# Patient Record
Sex: Male | Born: 1980 | Race: Asian | Hispanic: No | Marital: Married | Smoking: Never smoker
Health system: Southern US, Community
[De-identification: ages and names within clinical notes are randomized; demographics above are authoritative.]

## PROBLEM LIST (undated history)

## (undated) HISTORY — PX: NO PAST SURGERIES: SHX2092

---

## 2015-12-01 DIAGNOSIS — Z0000 Encounter for general adult medical examination without abnormal findings: Secondary | ICD-10-CM | POA: Diagnosis not present

## 2015-12-08 DIAGNOSIS — Z23 Encounter for immunization: Secondary | ICD-10-CM | POA: Diagnosis not present

## 2015-12-08 DIAGNOSIS — Z1389 Encounter for screening for other disorder: Secondary | ICD-10-CM | POA: Diagnosis not present

## 2015-12-08 DIAGNOSIS — Z6835 Body mass index (BMI) 35.0-35.9, adult: Secondary | ICD-10-CM | POA: Diagnosis not present

## 2015-12-08 DIAGNOSIS — Z0000 Encounter for general adult medical examination without abnormal findings: Secondary | ICD-10-CM | POA: Diagnosis not present

## 2016-07-21 ENCOUNTER — Emergency Department (HOSPITAL_BASED_OUTPATIENT_CLINIC_OR_DEPARTMENT_OTHER)
Admission: EM | Admit: 2016-07-21 | Discharge: 2016-07-22 | Disposition: A | Discharge status: Home / Self Care | Payer: BLUE CROSS/BLUE SHIELD | Attending: Emergency Medicine | Admitting: Emergency Medicine

## 2016-07-21 ENCOUNTER — Emergency Department (HOSPITAL_BASED_OUTPATIENT_CLINIC_OR_DEPARTMENT_OTHER): Payer: BLUE CROSS/BLUE SHIELD

## 2016-07-21 ENCOUNTER — Encounter (HOSPITAL_BASED_OUTPATIENT_CLINIC_OR_DEPARTMENT_OTHER): Payer: Self-pay | Admitting: *Deleted

## 2016-07-21 DIAGNOSIS — R0602 Shortness of breath: Secondary | ICD-10-CM | POA: Diagnosis not present

## 2016-07-21 DIAGNOSIS — J209 Acute bronchitis, unspecified: Secondary | ICD-10-CM

## 2016-07-21 LAB — COMPREHENSIVE METABOLIC PANEL
ALT: 40 U/L (ref 17–63)
AST: 31 U/L (ref 15–41)
Albumin: 4.5 g/dL (ref 3.5–5.0)
Alkaline Phosphatase: 72 U/L (ref 38–126)
Anion gap: 10 (ref 5–15)
BILIRUBIN TOTAL: 1.2 mg/dL (ref 0.3–1.2)
BUN: 12 mg/dL (ref 6–20)
CHLORIDE: 104 mmol/L (ref 101–111)
CO2: 25 mmol/L (ref 22–32)
CREATININE: 0.8 mg/dL (ref 0.61–1.24)
Calcium: 8.9 mg/dL (ref 8.9–10.3)
Glucose, Bld: 111 mg/dL — ABNORMAL HIGH (ref 65–99)
Potassium: 4 mmol/L (ref 3.5–5.1)
Sodium: 139 mmol/L (ref 135–145)
Total Protein: 8 g/dL (ref 6.5–8.1)

## 2016-07-21 LAB — CBC WITH DIFFERENTIAL/PLATELET
BASOS ABS: 0.1 10*3/uL (ref 0.0–0.1)
Basophils Relative: 1 %
EOS PCT: 9 %
Eosinophils Absolute: 1.2 10*3/uL — ABNORMAL HIGH (ref 0.0–0.7)
HEMATOCRIT: 44.3 % (ref 39.0–52.0)
Hemoglobin: 15.6 g/dL (ref 13.0–17.0)
LYMPHS PCT: 20 %
Lymphs Abs: 2.7 10*3/uL (ref 0.7–4.0)
MCH: 28.2 pg (ref 26.0–34.0)
MCHC: 35.2 g/dL (ref 30.0–36.0)
MCV: 80 fL (ref 78.0–100.0)
MONO ABS: 0.8 10*3/uL (ref 0.1–1.0)
Monocytes Relative: 6 %
NEUTROS ABS: 8.9 10*3/uL — AB (ref 1.7–7.7)
Neutrophils Relative %: 64 %
PLATELETS: 249 10*3/uL (ref 150–400)
RBC: 5.54 MIL/uL (ref 4.22–5.81)
RDW: 13.5 % (ref 11.5–15.5)
WBC: 13.7 10*3/uL — ABNORMAL HIGH (ref 4.0–10.5)

## 2016-07-21 MED ORDER — ALBUTEROL SULFATE (2.5 MG/3ML) 0.083% IN NEBU
2.5000 mg | INHALATION_SOLUTION | Freq: Once | RESPIRATORY_TRACT | Status: AC
  Administered 2016-07-21: 2.5 mg via RESPIRATORY_TRACT
  Filled 2016-07-21: qty 3

## 2016-07-21 MED ORDER — DEXAMETHASONE SODIUM PHOSPHATE 10 MG/ML IJ SOLN
10.0000 mg | Freq: Once | INTRAMUSCULAR | Status: AC
  Administered 2016-07-21: 10 mg via INTRAVENOUS
  Filled 2016-07-21: qty 1

## 2016-07-21 MED ORDER — IPRATROPIUM-ALBUTEROL 0.5-2.5 (3) MG/3ML IN SOLN
3.0000 mL | Freq: Once | RESPIRATORY_TRACT | Status: AC
  Administered 2016-07-21: 3 mL via RESPIRATORY_TRACT
  Filled 2016-07-21: qty 3

## 2016-07-21 NOTE — ED Triage Notes (Signed)
c/o fever 4 days ago, that resolved,  Cough, sob and wheezing x 4 days  Worse today

## 2016-07-21 NOTE — ED Provider Notes (Signed)
[facility] Provider Note: Lowella Dell, MD, FACEP  CSN: [id number] MRN: [id number] ARRIVAL: 07/21/16 at 2241 ROOM: [facility]  By signing my name below, I, Sonum Patel, attest that this documentation has been prepared under the direction and in the presence of Paula Libra, MD. Electronically Signed: Sonum Patel, Neurosurgeon. 07/21/16. 11:33 PM.  CHIEF COMPLAINT  No chief complaint on file.   HISTORY OF PRESENT ILLNESS  Michael Morris is a 36 y.o. male complaining of 1 week of SOB that worsened with wheezing this morning. He notes an associated cough productive of white phlegm. Symptoms were severe enough that he stayed inside his house and rested all day today. He denies exertional exacerbation. He reports having a low-grade fever 4 days ago for which he took Advil with relief. He denies current fever, vomiting, diarrhea, CP. He reports having allergies but denies history of asthma.   Respiratory therapist reports inspiratory and expiratory wheezing on arrival. Adult wheezing protocol was initiated. Albuterol  and Atrovent 0.5mg  were administered prior to my evaluation with improvement.  History reviewed. No pertinent past medical history.  History reviewed. No pertinent surgical history.  No family history on file.  Social History  Substance Use Topics  . Smoking status: Not on file  . Smokeless tobacco: Not on file  . Alcohol use Not on file    Prior to Admission medications   Not on File    Allergies Patient has no known allergies.   REVIEW OF SYSTEMS  Negative except as noted here or in the History of Present Illness.   PHYSICAL EXAMINATION  Initial Vital Signs Blood pressure 114/71, pulse 90, temperature 97.7 F (36.5 C), temperature source Oral, resp. rate (!) 24, SpO2 96 %.  Examination General: Well-developed, well-nourished male in no acute distress; appearance consistent with age of record HENT: normocephalic; atraumatic Eyes: pupils  equal, round and reactive to light; extraocular muscles intact Neck: supple Heart: regular rate and rhythm Lungs: faint inspiratory and expiratory wheezes; coughing on deep breaths Abdomen: soft; nondistended; nontender; no masses or hepatosplenomegaly; bowel sounds present Extremities: No deformity; full range of motion; pulses normal Neurologic: Awake, alert and oriented; motor function intact in all extremities and symmetric; no facial droop Skin: Warm and dry Psychiatric: Normal mood and affect   RESULTS  Summary of this visit's results, reviewed by myself:   EKG Interpretation  Date/Time:  Friday July 21 2016 22:53:04 EDT Ventricular Rate:  91 PR Interval:    QRS Duration: 92 QT Interval:  360 QTC Calculation: 443 R Axis:   84 Text Interpretation:  Sinus rhythm Normal ECG No previous ECGs available Confirmed by Aliviyah Malanga  MD, Jonny Ruiz ([id number]) on 07/21/2016 10:56:34 PM      Laboratory Studies: Results for orders placed or performed during the hospital encounter of 07/21/16 (from the past 24 hour(s))  CBC with Differential     Status: Abnormal   Collection Time: 07/21/16 10:58 PM  Result Value Ref Range   WBC 13.7 (H) 4.0 - 10.5 K/uL   RBC 5.54 4.22 - 5.81 MIL/uL   Hemoglobin 15.6 13.0 - 17.0 g/dL   HCT 62.1 30.8 - 65.7 %   MCV 80.0 78.0 - 100.0 fL   MCH 28.2 26.0 - 34.0 pg   MCHC 35.2 30.0 - 36.0 g/dL   RDW 84.6 96.2 - 95.2 %   Platelets 249 150 - 400 K/uL   Neutrophils Relative % 64 %   Neutro Abs 8.9 (H) 1.7 - 7.7 K/uL   Lymphocytes  Relative 20 %   Lymphs Abs 2.7 0.7 - 4.0 K/uL   Monocytes Relative 6 %   Monocytes Absolute 0.8 0.1 - 1.0 K/uL   Eosinophils Relative 9 %   Eosinophils Absolute 1.2 (H) 0.0 - 0.7 K/uL   Basophils Relative 1 %   Basophils Absolute 0.1 0.0 - 0.1 K/uL  Comprehensive metabolic panel     Status: Abnormal   Collection Time: 07/21/16 10:58 PM  Result Value Ref Range   Sodium 139 135 - 145 mmol/L   Potassium 4.0 3.5 - 5.1 mmol/L   Chloride  104 101 - 111 mmol/L   CO2 25 22 - 32 mmol/L   Glucose, Bld 111 (H) 65 - 99 mg/dL   BUN 12 6 - 20 mg/dL   Creatinine, Ser 1.61 0.61 - 1.24 mg/dL   Calcium 8.9 8.9 - 09.6 mg/dL   Total Protein 8.0 6.5 - 8.1 g/dL   Albumin 4.5 3.5 - 5.0 g/dL   AST 31 15 - 41 U/L   ALT 40 17 - 63 U/L   Alkaline Phosphatase 72 38 - 126 U/L   Total Bilirubin 1.2 0.3 - 1.2 mg/dL   GFR calc non Af Amer >60 >60 mL/min   GFR calc Af Amer >60 >60 mL/min   Anion gap 10 5 - 15   Imaging Studies: Dg Chest 2 View  Result Date: 07/21/2016 CLINICAL DATA:  Initial evaluation for acute shortness of breath. EXAM: CHEST  2 VIEW COMPARISON:  None available. FINDINGS: The heart size and mediastinal contours are within normal limits. Both lungs are clear. The visualized skeletal structures are unremarkable. IMPRESSION: No radiographic evidence for active cardiopulmonary disease. Electronically Signed   By: Rise Mu M.D.   On: 07/21/2016 23:23    ED COURSE  Nursing notes and initial vitals signs, including pulse oximetry, reviewed.  Vitals:   07/21/16 2245 07/21/16 2300 07/21/16 2303 07/21/16 2331  BP: 129/80  114/71   Pulse: (!) 103  90   Resp: (!) 24     Temp: 97.7 F (36.5 C)     TempSrc: Oral     SpO2: 94% 95% 96% 100%   12:40 AM Air movement significantly improved. Patient resting comfortably. Work of breathing significantly improved. Only faint expiratory wheezes remain in the bases. Patient states he is ready to go home. He was given an inhaler and AeroChamber and instructed in their use.  PROCEDURES    ED DIAGNOSES     ICD-9-CM ICD-10-CM   1. Acute bronchitis with bronchospasm 466.0 J20.9     I personally performed the services described in this documentation, which was scribed in my presence. The recorded information has been reviewed and is accurate.    Paula Libra, MD 07/22/16 [phone]

## 2016-07-22 MED ORDER — CETIRIZINE HCL 10 MG PO TABS: ORAL_TABLET | ORAL | Status: DC

## 2016-07-22 MED ORDER — DEXAMETHASONE 2 MG PO TABS: ORAL_TABLET | ORAL | 0 refills | Status: DC

## 2016-07-22 MED ORDER — ALBUTEROL SULFATE HFA 108 (90 BASE) MCG/ACT IN AERS
2.0000 | INHALATION_SPRAY | RESPIRATORY_TRACT | Status: DC | PRN
  Administered 2016-07-22: 2 via RESPIRATORY_TRACT
  Filled 2016-07-22: qty 6.7

## 2016-07-22 NOTE — ED Notes (Signed)
Pt verbalizes understanding of d/c instructions and denies any further needs at this time. 

## 2016-07-22 NOTE — ED Notes (Signed)
Pt states he is feeling a lot better

## 2016-11-08 DIAGNOSIS — S63601A Unspecified sprain of right thumb, initial encounter: Secondary | ICD-10-CM | POA: Diagnosis not present

## 2016-12-29 DIAGNOSIS — Z0000 Encounter for general adult medical examination without abnormal findings: Secondary | ICD-10-CM | POA: Diagnosis not present

## 2017-01-04 DIAGNOSIS — Z1389 Encounter for screening for other disorder: Secondary | ICD-10-CM | POA: Diagnosis not present

## 2017-01-04 DIAGNOSIS — Z23 Encounter for immunization: Secondary | ICD-10-CM | POA: Diagnosis not present

## 2017-01-04 DIAGNOSIS — Z0000 Encounter for general adult medical examination without abnormal findings: Secondary | ICD-10-CM | POA: Diagnosis not present

## 2017-03-26 DIAGNOSIS — M533 Sacrococcygeal disorders, not elsewhere classified: Secondary | ICD-10-CM | POA: Insufficient documentation

## 2017-03-26 DIAGNOSIS — Z5321 Procedure and treatment not carried out due to patient leaving prior to being seen by health care provider: Secondary | ICD-10-CM | POA: Insufficient documentation

## 2017-03-26 DIAGNOSIS — M545 Low back pain: Secondary | ICD-10-CM | POA: Insufficient documentation

## 2017-03-27 ENCOUNTER — Other Ambulatory Visit: Payer: Self-pay

## 2017-03-27 ENCOUNTER — Emergency Department (HOSPITAL_COMMUNITY)
Admission: EM | Admit: 2017-03-27 | Discharge: 2017-03-27 | Disposition: A | Discharge status: Home / Self Care | Payer: BLUE CROSS/BLUE SHIELD | Attending: Emergency Medicine | Admitting: Emergency Medicine

## 2017-03-27 ENCOUNTER — Encounter (HOSPITAL_COMMUNITY): Payer: Self-pay

## 2017-03-27 NOTE — ED Notes (Signed)
Unable to locate pt x1

## 2017-03-27 NOTE — ED Triage Notes (Signed)
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 He also reports numbness on his face  x 2-3 days, pt reports that he recently changed his sleeping position as well that may have triggered the symptoms.

## 2017-03-28 DIAGNOSIS — R208 Other disturbances of skin sensation: Secondary | ICD-10-CM | POA: Diagnosis not present

## 2017-03-28 DIAGNOSIS — R209 Unspecified disturbances of skin sensation: Secondary | ICD-10-CM | POA: Diagnosis not present

## 2017-03-28 DIAGNOSIS — Z6835 Body mass index (BMI) 35.0-35.9, adult: Secondary | ICD-10-CM | POA: Diagnosis not present

## 2017-04-19 ENCOUNTER — Other Ambulatory Visit: Payer: Self-pay | Admitting: Internal Medicine

## 2017-04-19 DIAGNOSIS — R209 Unspecified disturbances of skin sensation: Principal | ICD-10-CM

## 2017-04-19 DIAGNOSIS — IMO0001 Reserved for inherently not codable concepts without codable children: Secondary | ICD-10-CM

## 2017-04-22 ENCOUNTER — Other Ambulatory Visit: Payer: BLUE CROSS/BLUE SHIELD

## 2017-04-23 ENCOUNTER — Encounter: Payer: Self-pay | Admitting: Neurology

## 2017-04-23 ENCOUNTER — Other Ambulatory Visit: Payer: Self-pay

## 2017-04-23 ENCOUNTER — Ambulatory Visit: Payer: BLUE CROSS/BLUE SHIELD | Admitting: Neurology

## 2017-04-23 ENCOUNTER — Encounter (INDEPENDENT_AMBULATORY_CARE_PROVIDER_SITE_OTHER): Payer: Self-pay

## 2017-04-23 VITALS — BP 121/72 | HR 81 | Ht 70.0 in | Wt 247.5 lb

## 2017-04-23 DIAGNOSIS — R52 Pain, unspecified: Secondary | ICD-10-CM

## 2017-04-23 DIAGNOSIS — R202 Paresthesia of skin: Secondary | ICD-10-CM | POA: Insufficient documentation

## 2017-04-23 NOTE — Progress Notes (Signed)
PATIENT: Michael Morris DOB: 1981/02/20  Chief Complaint  Patient presents with  . New Patient (Initial Visit)    EMG room 3 with wife. Paper referral from Dr. Felipa Eth for parethesia.      HISTORICAL  Michael Morris is a 37 year old male, seen in refer by his primary care doctor Avva, Ravisankar for evaluation of numbness, initial evaluation was on January 2019.   He complains of left shoulder blade pain since March 22, 2017.  He slept on a hard surface from November - December 2018 due to his religious practice, around March 22, 2017, he noticed left lower scapular tenderness, especially after prolonged sitting, getting better if he gets up and move about, he also noticed intermittent mild left facial numbness, he denies dysarthria, no dysphagia,  He only noticed symptoms when he sits crosslegged on the floor for a while, he used to be able to sit for 2-3 hours without symptoms,  He also complains of intermittent bilateral SI joints pain, denies gait abnormality, denies bowel or bladder incontinence.  recent laboratory evaluation showed vitamin B12 deficiency 175, he is receiving vitamin B12 p.o. supplement, rest of the CMP CBC was within normal limits.   Laboratory evaluation seen December 2018, normal creatinine 0.8, glucose 93, hemoglobin 16.2,  REVIEW OF SYSTEMS: Full 14 system review of systems performed and notable only for as above  ALLERGIES: No Known Allergies  HOME MEDICATIONS: Current Outpatient Medications  Medication Sig Dispense Refill  . ASPIRIN 81 PO Take 1 tablet by mouth as needed.    . Multiple Vitamin (MULTIVITAMIN) tablet Take 1 tablet by mouth daily.    . vitamin B-12 (CYANOCOBALAMIN) 1000 MCG tablet Take 1,000 mcg by mouth daily.     No current facility-administered medications for this visit.     PAST MEDICAL HISTORY: No past medical history on file.  PAST SURGICAL HISTORY: Past Surgical History:  Procedure Laterality Date  . NO  PAST SURGERIES      FAMILY HISTORY: No family history on file.  SOCIAL HISTORY:  Social History   Socioeconomic History  . Marital status: Married    Spouse name: Not on file  . Number of children: 2  . Years of education: PhD  . [facility] education level: Not on file  Social Needs  . Financial resource strain: Not on file  . Food insecurity - worry: Not on file  . Food insecurity - inability: Not on file  . Transportation needs - medical: Not on file  . Transportation needs - non-medical: Not on file  Occupational History  . Occupation: [employer], clinical  Tobacco Use  . Smoking status: Never Smoker  . Smokeless tobacco: Never Used  Substance and Sexual Activity  . Alcohol use: No    Frequency: Never  . Drug use: No  . Sexual activity: Not on file  Other Topics Concern  . Not on file  Social History Narrative   Right handed    Lives with family   Tea daily     PHYSICAL EXAM   Vitals:   04/23/17 1344  BP: 121/72  Pulse: 81  Weight: 247 lb 8 oz (112.3 kg)  Height: 5\' 10"  (1.778 m)    Not recorded      Body mass index is 35.51 kg/m.  PHYSICAL EXAMNIATION:  Gen: NAD, conversant, well nourised, obese, well groomed                     Cardiovascular: Regular rate rhythm, no peripheral  edema, warm, nontender. Eyes: Conjunctivae clear without exudates or hemorrhage Neck: Supple, no carotid bruits. Pulmonary: Clear to auscultation bilaterally   NEUROLOGICAL EXAM:  MENTAL STATUS: Speech:    Speech is normal; fluent and spontaneous with normal comprehension.  Cognition:     Orientation to time, place and person     Normal recent and remote memory     Normal Attention span and concentration     Normal Language, naming, repeating,spontaneous speech     Fund of knowledge   CRANIAL NERVES: CN II: Visual fields are full to confrontation. Fundoscopic exam is normal with sharp discs and no vascular changes. Pupils are round equal and briskly reactive to light. CN  III, IV, VI: extraocular movement are normal. No ptosis. CN V: Facial sensation is intact to pinprick in all 3 divisions bilaterally. Corneal responses are intact.  CN VII: Face is symmetric with normal eye closure and smile. CN VIII: Hearing is normal to rubbing fingers CN IX, X: Palate elevates symmetrically. Phonation is normal. CN XI: Head turning and shoulder shrug are intact CN XII: Tongue is midline with normal movements and no atrophy.  MOTOR: There is no pronator drift of out-stretched arms. Muscle bulk and tone are normal. Muscle strength is normal.  REFLEXES: Reflexes are 2+ and symmetric at the biceps, triceps, knees, and ankles. Plantar responses are flexor.  SENSORY: Intact to light touch, pinprick, positional sensation and vibratory sensation are intact in fingers and toes.  COORDINATION: Rapid alternating movements and fine finger movements are intact. There is no dysmetria on finger-to-nose and heel-knee-shin.    GAIT/STANCE: Posture is normal. Gait is steady with normal steps, base, arm swing, and turning. Heel and toe walking are normal. Tandem gait is normal.  Romberg is absent.   DIAGNOSTIC DATA (LABS, IMAGING, TESTING) - I reviewed patient records, labs, notes, testing and imaging myself where available.   ASSESSMENT AND PLAN  Raynelle JanMurali Colborn is a 37 y.o. male   Left shoulder blade tenderness,  Most consistent with musculoskeletal etiology  I have suggested him to do heating pad, NSAIDs as needed, back stretching exercise  Intermittent left facial paresthesia  MRI of the brain to rule out structural lesion   Levert FeinsteinYijun Audry Kauzlarich, M.D. Ph.D.  [facility] [address], [address] [place], [place] [address] Ph: [phone]) [phone] Fax: [phone]  CC: Chilton GreathouseAvva, Ravisankar, MD

## 2017-05-01 ENCOUNTER — Ambulatory Visit
Admission: RE | Admit: 2017-05-01 | Discharge: 2017-05-01 | Disposition: A | Discharge status: Home / Self Care | Payer: BLUE CROSS/BLUE SHIELD | Source: Ambulatory Visit | Attending: Neurology | Admitting: Neurology

## 2017-05-01 DIAGNOSIS — R202 Paresthesia of skin: Secondary | ICD-10-CM | POA: Diagnosis not present

## 2017-05-01 DIAGNOSIS — R52 Pain, unspecified: Secondary | ICD-10-CM

## 2017-05-02 ENCOUNTER — Telehealth: Payer: Self-pay | Admitting: *Deleted

## 2017-05-02 NOTE — Telephone Encounter (Addendum)
Spoke to patient - he is aware of his normal results.  He is going to watch his symptoms in hopes of improvement without medication.  He will call back if he is not getting better.  Per vo by Dr. Terrace ArabiaYan, she may consider providing duloxetine 60mg  daily.

## 2017-05-02 NOTE — Telephone Encounter (Signed)
-----   Message from Levert FeinsteinYijun Yan, MD sent at 05/02/2017  3:45 PM EST ----- Please call pt for normal MRI brain.

## 2017-05-08 DIAGNOSIS — M7071 Other bursitis of hip, right hip: Secondary | ICD-10-CM | POA: Diagnosis not present

## 2017-05-08 DIAGNOSIS — M9903 Segmental and somatic dysfunction of lumbar region: Secondary | ICD-10-CM | POA: Diagnosis not present

## 2017-05-08 DIAGNOSIS — M9902 Segmental and somatic dysfunction of thoracic region: Secondary | ICD-10-CM | POA: Diagnosis not present

## 2017-05-08 DIAGNOSIS — M9901 Segmental and somatic dysfunction of cervical region: Secondary | ICD-10-CM | POA: Diagnosis not present

## 2017-05-08 DIAGNOSIS — M9905 Segmental and somatic dysfunction of pelvic region: Secondary | ICD-10-CM | POA: Diagnosis not present

## 2017-05-08 DIAGNOSIS — M792 Neuralgia and neuritis, unspecified: Secondary | ICD-10-CM | POA: Diagnosis not present

## 2017-05-11 DIAGNOSIS — M9905 Segmental and somatic dysfunction of pelvic region: Secondary | ICD-10-CM | POA: Diagnosis not present

## 2017-05-11 DIAGNOSIS — M7071 Other bursitis of hip, right hip: Secondary | ICD-10-CM | POA: Diagnosis not present

## 2017-05-11 DIAGNOSIS — M9903 Segmental and somatic dysfunction of lumbar region: Secondary | ICD-10-CM | POA: Diagnosis not present

## 2017-05-11 DIAGNOSIS — M9902 Segmental and somatic dysfunction of thoracic region: Secondary | ICD-10-CM | POA: Diagnosis not present

## 2017-05-11 DIAGNOSIS — M792 Neuralgia and neuritis, unspecified: Secondary | ICD-10-CM | POA: Diagnosis not present

## 2017-05-11 DIAGNOSIS — M9901 Segmental and somatic dysfunction of cervical region: Secondary | ICD-10-CM | POA: Diagnosis not present

## 2017-05-14 DIAGNOSIS — M7071 Other bursitis of hip, right hip: Secondary | ICD-10-CM | POA: Diagnosis not present

## 2017-05-14 DIAGNOSIS — M9903 Segmental and somatic dysfunction of lumbar region: Secondary | ICD-10-CM | POA: Diagnosis not present

## 2017-05-14 DIAGNOSIS — M9901 Segmental and somatic dysfunction of cervical region: Secondary | ICD-10-CM | POA: Diagnosis not present

## 2017-05-14 DIAGNOSIS — M792 Neuralgia and neuritis, unspecified: Secondary | ICD-10-CM | POA: Diagnosis not present

## 2017-05-14 DIAGNOSIS — M9905 Segmental and somatic dysfunction of pelvic region: Secondary | ICD-10-CM | POA: Diagnosis not present

## 2017-05-14 DIAGNOSIS — M9902 Segmental and somatic dysfunction of thoracic region: Secondary | ICD-10-CM | POA: Diagnosis not present

## 2017-05-18 DIAGNOSIS — M9905 Segmental and somatic dysfunction of pelvic region: Secondary | ICD-10-CM | POA: Diagnosis not present

## 2017-05-18 DIAGNOSIS — M9902 Segmental and somatic dysfunction of thoracic region: Secondary | ICD-10-CM | POA: Diagnosis not present

## 2017-05-18 DIAGNOSIS — M7071 Other bursitis of hip, right hip: Secondary | ICD-10-CM | POA: Diagnosis not present

## 2017-05-18 DIAGNOSIS — M792 Neuralgia and neuritis, unspecified: Secondary | ICD-10-CM | POA: Diagnosis not present

## 2017-05-18 DIAGNOSIS — M9901 Segmental and somatic dysfunction of cervical region: Secondary | ICD-10-CM | POA: Diagnosis not present

## 2017-05-18 DIAGNOSIS — M9903 Segmental and somatic dysfunction of lumbar region: Secondary | ICD-10-CM | POA: Diagnosis not present

## 2018-01-24 IMAGING — CR DG CHEST 2V
2 series · 2 of 2 positions shown · non-contrast
Comparison: None available.

CLINICAL DATA: Initial evaluation for acute shortness of breath.

EXAM:
CHEST  2 VIEW

[w chest pa]
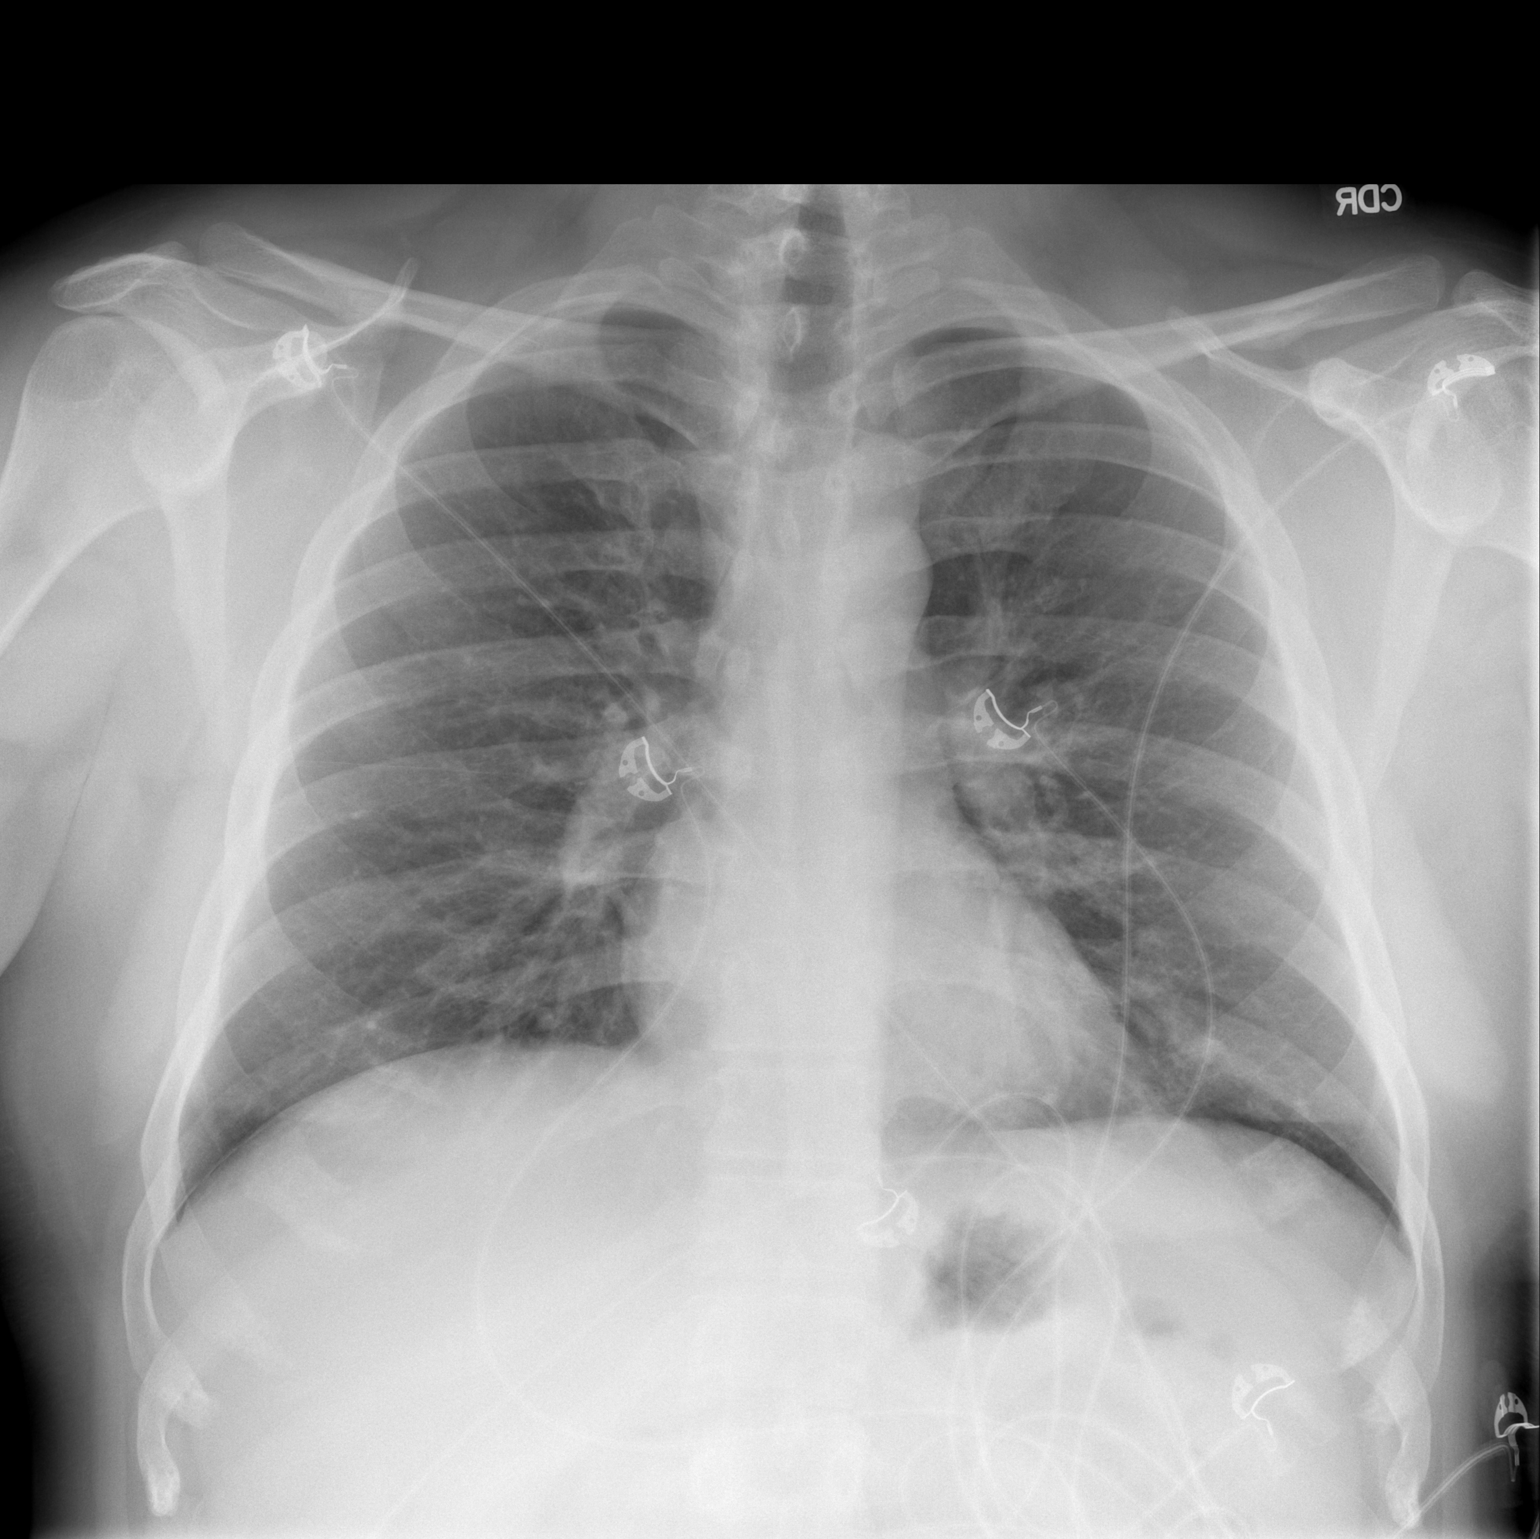

[w chest lat]
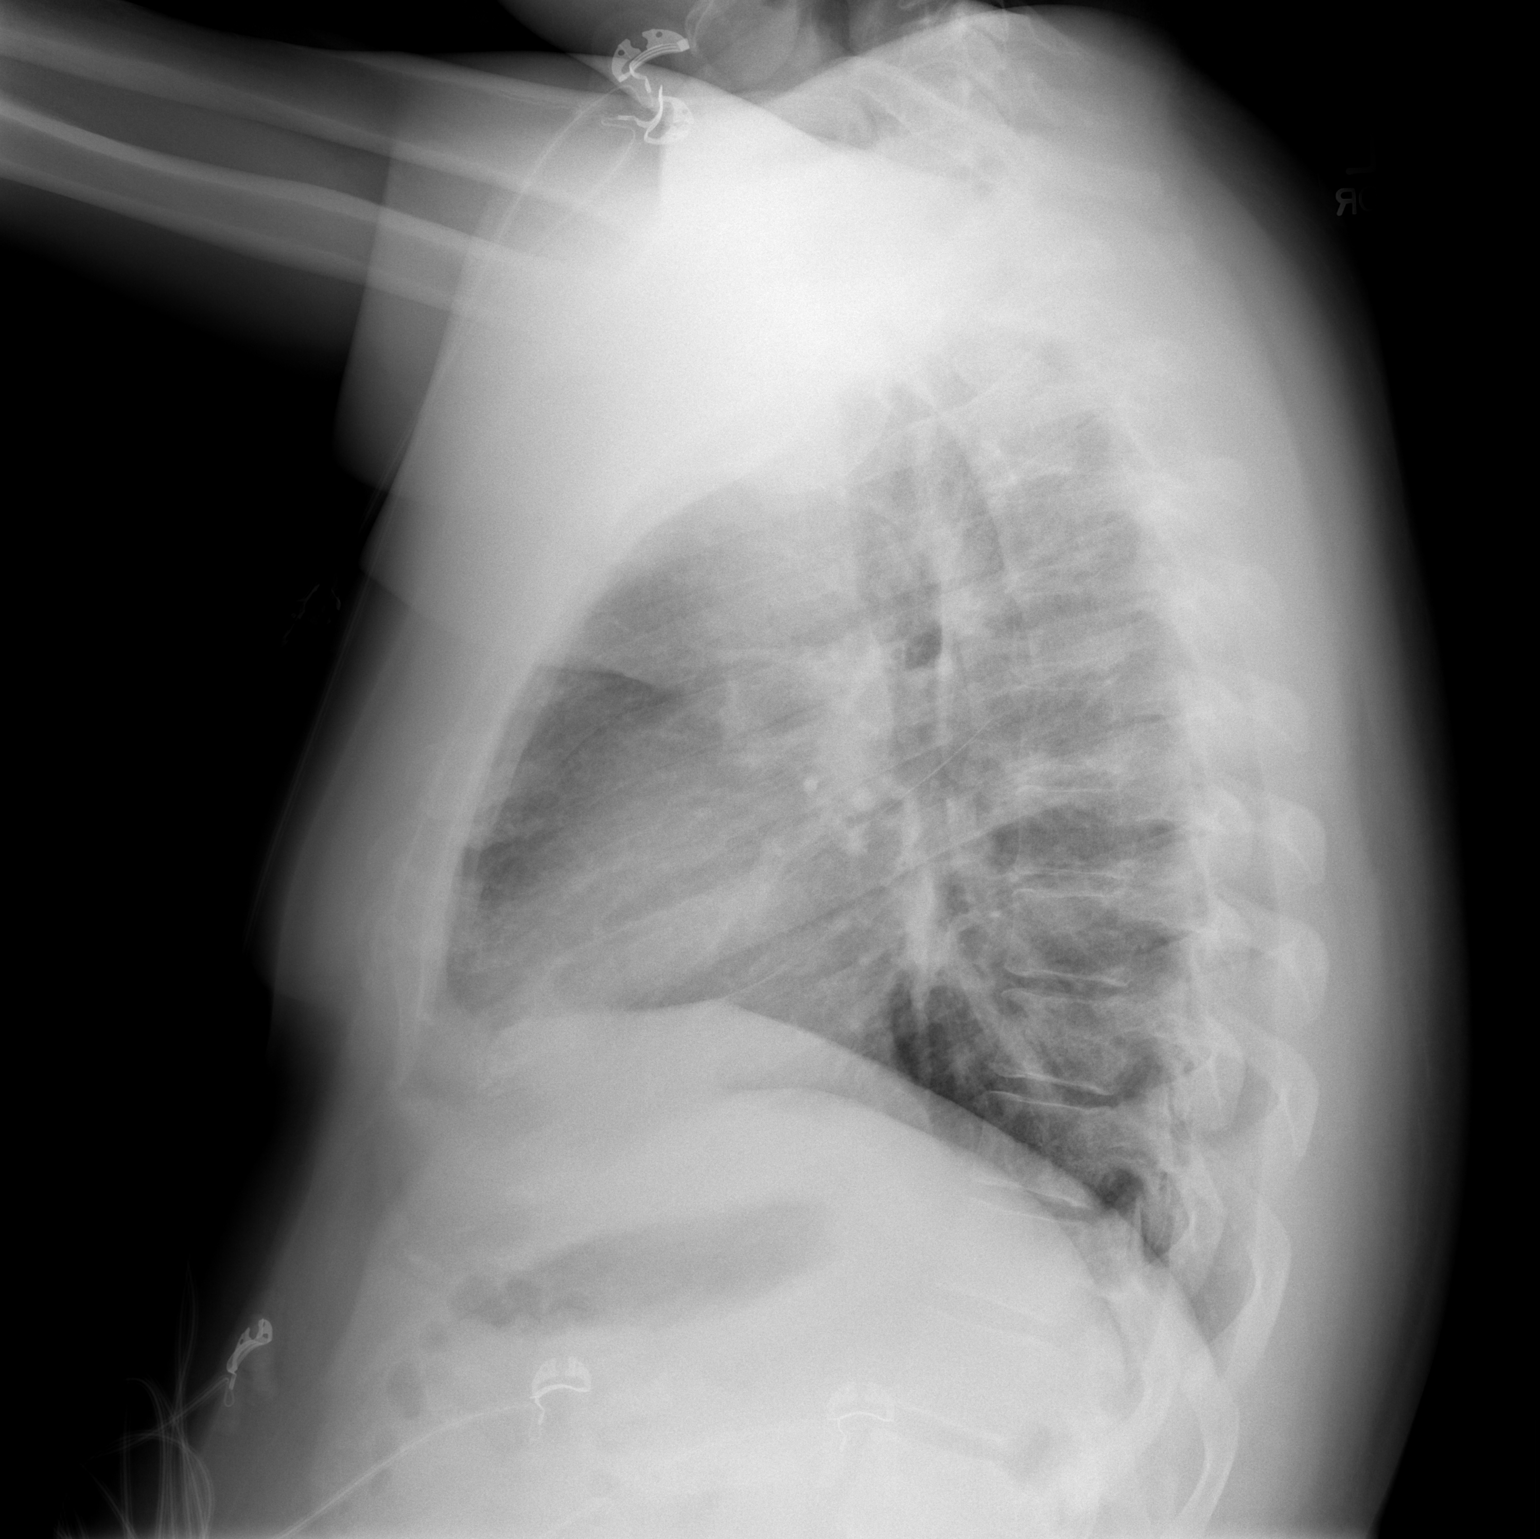

[2 of 2 positions shown; findings below may reference images not displayed]

FINDINGS: The heart size and mediastinal contours are within normal limits.
Both lungs are clear. The visualized skeletal structures are
unremarkable.
IMPRESSION: No radiographic evidence for active cardiopulmonary disease.

## 2018-01-31 DIAGNOSIS — Z0000 Encounter for general adult medical examination without abnormal findings: Secondary | ICD-10-CM | POA: Diagnosis not present

## 2018-01-31 DIAGNOSIS — Z23 Encounter for immunization: Secondary | ICD-10-CM | POA: Diagnosis not present

## 2018-02-07 DIAGNOSIS — Z0000 Encounter for general adult medical examination without abnormal findings: Secondary | ICD-10-CM | POA: Diagnosis not present

## 2018-02-07 DIAGNOSIS — Z1389 Encounter for screening for other disorder: Secondary | ICD-10-CM | POA: Diagnosis not present

## 2018-10-22 DIAGNOSIS — S335XXA Sprain of ligaments of lumbar spine, initial encounter: Secondary | ICD-10-CM | POA: Diagnosis not present

## 2019-02-07 DIAGNOSIS — Z0000 Encounter for general adult medical examination without abnormal findings: Secondary | ICD-10-CM | POA: Diagnosis not present

## 2019-02-12 DIAGNOSIS — Z1331 Encounter for screening for depression: Secondary | ICD-10-CM | POA: Diagnosis not present

## 2019-02-12 DIAGNOSIS — Z23 Encounter for immunization: Secondary | ICD-10-CM | POA: Diagnosis not present

## 2019-02-12 DIAGNOSIS — Z0000 Encounter for general adult medical examination without abnormal findings: Secondary | ICD-10-CM | POA: Diagnosis not present

## 2019-05-17 ENCOUNTER — Ambulatory Visit: Payer: BLUE CROSS/BLUE SHIELD

## 2022-07-18 DIAGNOSIS — E669 Obesity, unspecified: Secondary | ICD-10-CM | POA: Diagnosis not present

## 2022-07-18 DIAGNOSIS — Z79899 Other long term (current) drug therapy: Secondary | ICD-10-CM | POA: Diagnosis not present

## 2022-10-17 DIAGNOSIS — R072 Precordial pain: Secondary | ICD-10-CM | POA: Diagnosis not present

## 2022-10-17 DIAGNOSIS — R059 Cough, unspecified: Secondary | ICD-10-CM | POA: Diagnosis not present

## 2022-10-17 DIAGNOSIS — R053 Chronic cough: Secondary | ICD-10-CM | POA: Diagnosis not present

## 2022-10-17 DIAGNOSIS — R0789 Other chest pain: Secondary | ICD-10-CM | POA: Diagnosis not present

## 2022-10-17 DIAGNOSIS — R079 Chest pain, unspecified: Secondary | ICD-10-CM | POA: Diagnosis not present

## 2022-11-13 DIAGNOSIS — Z008 Encounter for other general examination: Secondary | ICD-10-CM | POA: Diagnosis not present

## 2023-07-07 DIAGNOSIS — R062 Wheezing: Secondary | ICD-10-CM | POA: Diagnosis not present

## 2023-07-07 DIAGNOSIS — R0602 Shortness of breath: Secondary | ICD-10-CM | POA: Diagnosis not present

## 2023-07-07 DIAGNOSIS — J302 Other seasonal allergic rhinitis: Secondary | ICD-10-CM | POA: Diagnosis not present

## 2023-08-01 DIAGNOSIS — Z1212 Encounter for screening for malignant neoplasm of rectum: Secondary | ICD-10-CM | POA: Diagnosis not present

## 2023-08-02 DIAGNOSIS — R7989 Other specified abnormal findings of blood chemistry: Secondary | ICD-10-CM | POA: Diagnosis not present

## 2023-08-02 DIAGNOSIS — Z0000 Encounter for general adult medical examination without abnormal findings: Secondary | ICD-10-CM | POA: Diagnosis not present

## 2023-08-02 DIAGNOSIS — E669 Obesity, unspecified: Secondary | ICD-10-CM | POA: Diagnosis not present

## 2023-08-08 DIAGNOSIS — R82998 Other abnormal findings in urine: Secondary | ICD-10-CM | POA: Diagnosis not present

## 2023-08-08 DIAGNOSIS — E669 Obesity, unspecified: Secondary | ICD-10-CM | POA: Diagnosis not present

## 2023-08-08 DIAGNOSIS — Z833 Family history of diabetes mellitus: Secondary | ICD-10-CM | POA: Diagnosis not present

## 2023-08-08 DIAGNOSIS — J45909 Unspecified asthma, uncomplicated: Secondary | ICD-10-CM | POA: Diagnosis not present

## 2023-08-08 DIAGNOSIS — Z1339 Encounter for screening examination for other mental health and behavioral disorders: Secondary | ICD-10-CM | POA: Diagnosis not present

## 2023-08-08 DIAGNOSIS — B354 Tinea corporis: Secondary | ICD-10-CM | POA: Diagnosis not present

## 2023-08-08 DIAGNOSIS — Z1331 Encounter for screening for depression: Secondary | ICD-10-CM | POA: Diagnosis not present

## 2023-08-08 DIAGNOSIS — J309 Allergic rhinitis, unspecified: Secondary | ICD-10-CM | POA: Diagnosis not present

## 2023-08-15 DIAGNOSIS — J4540 Moderate persistent asthma, uncomplicated: Secondary | ICD-10-CM | POA: Diagnosis not present

## 2023-08-15 DIAGNOSIS — J310 Chronic rhinitis: Secondary | ICD-10-CM | POA: Diagnosis not present

## 2023-09-17 DIAGNOSIS — T7840XA Allergy, unspecified, initial encounter: Secondary | ICD-10-CM | POA: Diagnosis not present

## 2023-09-17 DIAGNOSIS — L509 Urticaria, unspecified: Secondary | ICD-10-CM | POA: Diagnosis not present

## 2023-09-25 DIAGNOSIS — J4540 Moderate persistent asthma, uncomplicated: Secondary | ICD-10-CM | POA: Diagnosis not present

## 2023-09-25 DIAGNOSIS — J301 Allergic rhinitis due to pollen: Secondary | ICD-10-CM | POA: Diagnosis not present

## 2023-09-25 DIAGNOSIS — J3089 Other allergic rhinitis: Secondary | ICD-10-CM | POA: Diagnosis not present

## 2023-09-25 DIAGNOSIS — L309 Dermatitis, unspecified: Secondary | ICD-10-CM | POA: Diagnosis not present

## 2023-12-12 DIAGNOSIS — L299 Pruritus, unspecified: Secondary | ICD-10-CM | POA: Diagnosis not present

## 2023-12-12 DIAGNOSIS — Z7951 Long term (current) use of inhaled steroids: Secondary | ICD-10-CM | POA: Diagnosis not present

## 2023-12-12 DIAGNOSIS — L309 Dermatitis, unspecified: Secondary | ICD-10-CM | POA: Diagnosis not present

## 2023-12-12 DIAGNOSIS — J3089 Other allergic rhinitis: Secondary | ICD-10-CM | POA: Diagnosis not present

## 2023-12-12 DIAGNOSIS — J4540 Moderate persistent asthma, uncomplicated: Secondary | ICD-10-CM | POA: Diagnosis not present

## 2023-12-12 DIAGNOSIS — Z79899 Other long term (current) drug therapy: Secondary | ICD-10-CM | POA: Diagnosis not present

## 2023-12-12 DIAGNOSIS — J301 Allergic rhinitis due to pollen: Secondary | ICD-10-CM | POA: Diagnosis not present

## 2023-12-27 DIAGNOSIS — J309 Allergic rhinitis, unspecified: Secondary | ICD-10-CM | POA: Diagnosis not present

## 2023-12-31 DIAGNOSIS — J309 Allergic rhinitis, unspecified: Secondary | ICD-10-CM | POA: Diagnosis not present

## 2024-01-08 DIAGNOSIS — J309 Allergic rhinitis, unspecified: Secondary | ICD-10-CM | POA: Diagnosis not present

## 2024-01-14 DIAGNOSIS — J309 Allergic rhinitis, unspecified: Secondary | ICD-10-CM | POA: Diagnosis not present

## 2024-01-22 DIAGNOSIS — J309 Allergic rhinitis, unspecified: Secondary | ICD-10-CM | POA: Diagnosis not present

## 2024-01-28 DIAGNOSIS — J309 Allergic rhinitis, unspecified: Secondary | ICD-10-CM | POA: Diagnosis not present

## 2024-02-11 DIAGNOSIS — J3089 Other allergic rhinitis: Secondary | ICD-10-CM | POA: Diagnosis not present

## 2024-02-11 DIAGNOSIS — J3081 Allergic rhinitis due to animal (cat) (dog) hair and dander: Secondary | ICD-10-CM | POA: Diagnosis not present

## 2024-02-11 DIAGNOSIS — J301 Allergic rhinitis due to pollen: Secondary | ICD-10-CM | POA: Diagnosis not present

## 2024-02-19 DIAGNOSIS — J3081 Allergic rhinitis due to animal (cat) (dog) hair and dander: Secondary | ICD-10-CM | POA: Diagnosis not present

## 2024-02-19 DIAGNOSIS — J301 Allergic rhinitis due to pollen: Secondary | ICD-10-CM | POA: Diagnosis not present

## 2024-02-19 DIAGNOSIS — J3089 Other allergic rhinitis: Secondary | ICD-10-CM | POA: Diagnosis not present

## 2024-02-26 DIAGNOSIS — J3081 Allergic rhinitis due to animal (cat) (dog) hair and dander: Secondary | ICD-10-CM | POA: Diagnosis not present

## 2024-02-26 DIAGNOSIS — J3089 Other allergic rhinitis: Secondary | ICD-10-CM | POA: Diagnosis not present

## 2024-02-26 DIAGNOSIS — J301 Allergic rhinitis due to pollen: Secondary | ICD-10-CM | POA: Diagnosis not present

## 2024-03-04 DIAGNOSIS — J3081 Allergic rhinitis due to animal (cat) (dog) hair and dander: Secondary | ICD-10-CM | POA: Diagnosis not present

## 2024-03-04 DIAGNOSIS — J301 Allergic rhinitis due to pollen: Secondary | ICD-10-CM | POA: Diagnosis not present

## 2024-03-04 DIAGNOSIS — J3089 Other allergic rhinitis: Secondary | ICD-10-CM | POA: Diagnosis not present
